# Patient Record
Sex: Male | Born: 2005 | Race: Black or African American | Hispanic: No | Marital: Single | Smoking: Never smoker
Health system: Southern US, Community
[De-identification: ages and names within clinical notes are randomized; demographics above are authoritative.]

## PROBLEM LIST (undated history)

## (undated) HISTORY — PX: EPISPADIUS CORRECTION: SHX624

---
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
## 2024-07-10 ENCOUNTER — Emergency Department (HOSPITAL_COMMUNITY)

## 2024-07-10 ENCOUNTER — Emergency Department (HOSPITAL_COMMUNITY): Admission: EM | Admit: 2024-07-10 | Discharge: 2024-07-10 | Disposition: A | Discharge status: Rehabilitation Facility

## 2024-07-10 ENCOUNTER — Encounter (HOSPITAL_COMMUNITY): Payer: Self-pay | Admitting: *Deleted

## 2024-07-10 ENCOUNTER — Other Ambulatory Visit: Payer: Self-pay

## 2024-07-10 DIAGNOSIS — R519 Headache, unspecified: Secondary | ICD-10-CM | POA: Diagnosis present

## 2024-07-10 DIAGNOSIS — Z91010 Allergy to peanuts: Secondary | ICD-10-CM | POA: Insufficient documentation

## 2024-07-10 DIAGNOSIS — S80212A Abrasion, left knee, initial encounter: Secondary | ICD-10-CM | POA: Diagnosis not present

## 2024-07-10 DIAGNOSIS — S60511A Abrasion of right hand, initial encounter: Secondary | ICD-10-CM | POA: Diagnosis not present

## 2024-07-10 DIAGNOSIS — Y92410 Unspecified street and highway as the place of occurrence of the external cause: Secondary | ICD-10-CM | POA: Diagnosis not present

## 2024-07-10 DIAGNOSIS — S0181XA Laceration without foreign body of other part of head, initial encounter: Secondary | ICD-10-CM | POA: Diagnosis not present

## 2024-07-10 MED ORDER — CEPHALEXIN 500 MG PO CAPS: 500.0000 mg | ORAL_CAPSULE | Freq: Four times a day (QID) | ORAL | 0 refills | Status: AC

## 2024-07-10 MED ORDER — LIDOCAINE-EPINEPHRINE (PF) 2 %-1:200000 IJ SOLN
20.0000 mL | Freq: Once | INTRAMUSCULAR | Status: AC
  Administered 2024-07-10: 20 mL
  Filled 2024-07-10: qty 20

## 2024-07-10 MED ORDER — CEPHALEXIN 500 MG PO CAPS: 500.0000 mg | ORAL_CAPSULE | Freq: Four times a day (QID) | ORAL | 0 refills | Status: DC
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
## 2024-07-10 NOTE — ED Notes (Signed)
 Suture cart at bedside
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
